# Patient Record
Sex: Female | Born: 2000 | Hispanic: Yes | Marital: Single | Smoking: Never smoker
Health system: Southern US, Community
[De-identification: ages and names within clinical notes are randomized; demographics above are authoritative.]

## PROBLEM LIST (undated history)

## (undated) DIAGNOSIS — F32A Depression, unspecified: Secondary | ICD-10-CM

## (undated) DIAGNOSIS — H539 Unspecified visual disturbance: Secondary | ICD-10-CM

## (undated) DIAGNOSIS — F329 Major depressive disorder, single episode, unspecified: Secondary | ICD-10-CM

## (undated) DIAGNOSIS — R270 Ataxia, unspecified: Secondary | ICD-10-CM

## (undated) DIAGNOSIS — F419 Anxiety disorder, unspecified: Secondary | ICD-10-CM

---
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
## 2022-12-23 ENCOUNTER — Other Ambulatory Visit: Payer: Self-pay | Admitting: Family Medicine

## 2022-12-23 DIAGNOSIS — I872 Venous insufficiency (chronic) (peripheral): Secondary | ICD-10-CM

## 2023-01-27 ENCOUNTER — Ambulatory Visit
Admission: RE | Admit: 2023-01-27 | Discharge: 2023-01-27 | Disposition: A | Discharge status: Home / Self Care | Payer: Medicaid Other | Source: Ambulatory Visit | Attending: Family Medicine | Admitting: Family Medicine

## 2023-01-27 DIAGNOSIS — I872 Venous insufficiency (chronic) (peripheral): Secondary | ICD-10-CM

## 2023-01-27 NOTE — Consult Note (Signed)
Chief Complaint: Patient was seen in consultation today for lower extremity swelling  Referring Physician(s): Picarra,Emerald G  History of Present Illness: Sheila Mckinney is a 22 y.o. female that complains of bilateral lower extremity swelling.  Patient reports mild pain and heaviness in the legs after the legs have been crossed.  No significant injury to the lower extremities.  No complaints of bleeding or ulceration.  Patient reports remote history of cerebellar ataxia which has resolved.  Patient does not wear compression stockings.  Past Medical History:  Diagnosis Date   Anxiety    Ataxia    Depression    Vision abnormalities     No past surgical history on file.  Allergies: Patient has no known allergies.  Medications: Prior to Admission medications   Not on File     Family History  Problem Relation Age of Onset   Hypertension Mother    Hypertension Maternal Aunt     Social History   Socioeconomic History   Marital status: Single    Spouse name: Not on file   Number of children: Not on file   Years of education: Not on file   Highest education level: Not on file  Occupational History   Not on file  Tobacco Use   Smoking status: Never   Smokeless tobacco: Never  Substance and Sexual Activity   Alcohol use: No   Drug use: No   Sexual activity: Not on file  Other Topics Concern   Not on file  Social History Narrative   In 7th grade at [employer].      Confidentiality was discussed with the patient and if applicable, with caregiver as well.      Patient's personal or confidential phone number: [phone]   Tobacco?  no   Drugs/ETOH?  no   Partner preference?  female Sexually Active?  no    Pregnancy Prevention:  N/A, reviewed condoms & plan B   Safe at home, in school & in relationships?  yes, except for school concerns   Safe to self?   yes   Guns in the home?  no       Social Determinants of Corporate investment banker Strain: Not  on file  Food Insecurity: Not on file  Transportation Needs: Not on file  Physical Activity: Not on file  Stress: Not on file  Social Connections: Not on file    Review of Systems  Constitutional:  Negative for activity change.  Cardiovascular:  Positive for leg swelling.    Vital Signs: There were no vitals taken for this visit.    Physical Exam Constitutional:      Appearance: She is not ill-appearing.  Musculoskeletal:     Comments: No pitting edema in the lower extremities  No evidence for varices.  No ulcerations.  Difficult to palpate pedal pulses.  Neurological:     Mental Status: She is alert.     Imaging:  CLINICAL DATA:  22 year old with bilateral lower extremity swelling. Evaluate for venous insufficiency.   EXAM: BILATERAL LOWER EXTREMITY VENOUS DUPLEX ULTRASOUND   TECHNIQUE: Gray-scale sonography with graded compression, as well as color Doppler and duplex ultrasound, were performed to evaluate the deep and superficial veins of both lower extremities. Spectral Doppler was utilized to evaluate flow at rest and with distal augmentation maneuvers. A complete superficial venous insufficiency exam was performed in the upright standing position. I personally performed the technical portion of the exam.   COMPARISON:  None Available.  FINDINGS: Right lower extremity:   Deep venous system: Normal compressibility, augmentation and color Doppler flow in the right common femoral vein, right femoral vein and right popliteal vein without thrombus. The right saphenofemoral junction is patent. Visualized right deep calf veins are patent without thrombus.   Superficial venous system: Right great saphenous vein is compressible with normal caliber. No significant reflux in the right great saphenous vein. Question minimal reflux in the proximal calf but this is not reproducible. No reflux in the right short saphenous vein. No varicosities.   Left lower  extremity:   Deep venous system: Normal compressibility, augmentation and color Doppler flow in the left common femoral vein, left femoral vein and left popliteal vein without thrombus. The left saphenofemoral junction is patent. Visualized left deep calf veins are patent without thrombus.   Superficial venous system: Left great saphenous is compressible with normal caliber and no evidence for reflux. No reflux in the left short saphenous vein. No varicosities.   IMPRESSION: 1. No significant superficial venous insufficiency in the lower extremities. 2. No evidence of deep venous thrombosis in the lower extremities.     Electronically Signed   By: Richarda Overlie M.D.   On: 01/27/2023 12:19  Assessment and Plan:  22 year old with complaints of lower extremity edema and occasional heaviness and pain in the legs.  On examination, the patient does not have significant or focal swelling.  Patient is slightly overweight which may be contributing to the symptoms.  There is no evidence for venous insufficiency or DVT based on the ultrasound examination.  Explained imaging findings to the patient.  Suggested wearing compression stockings if she feels like the legs are swelling.  No need for vascular intervention.  Patient can follow-up with interventional radiology as needed.  Thank you for this interesting consult.  I greatly enjoyed meeting Sheila Mckinney and look forward to participating in their care.  A copy of this report was sent to the requesting provider on this date.  Electronically Signed: Arn Medal 01/27/2023, 12:21 PM   I spent a total of  15 Minutes   in face to face in clinical consultation, greater than 50% of which was counseling/coordinating care for leg swelling.

## 2024-05-11 ENCOUNTER — Other Ambulatory Visit: Payer: Self-pay | Admitting: Family Medicine

## 2024-05-11 DIAGNOSIS — N644 Mastodynia: Secondary | ICD-10-CM

## 2024-06-09 ENCOUNTER — Other Ambulatory Visit
